# Patient Record
Sex: Male | Born: 2000 | Race: White | Hispanic: No | Marital: Single | Smoking: Never smoker
Health system: Southern US, Community
[De-identification: ages and names within clinical notes are randomized; demographics above are authoritative.]

## PROBLEM LIST (undated history)

## (undated) DIAGNOSIS — J351 Hypertrophy of tonsils: Secondary | ICD-10-CM

## (undated) DIAGNOSIS — F909 Attention-deficit hyperactivity disorder, unspecified type: Secondary | ICD-10-CM

## (undated) HISTORY — PX: OTHER SURGICAL HISTORY: SHX169

## (undated) HISTORY — PX: ADENOIDECTOMY: SUR15

## (undated) HISTORY — DX: Hypertrophy of tonsils: J35.1

---
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
## 2022-08-28 IMAGING — DX DG WRIST COMPLETE 3+V*L*
4 series · 4 of 4 positions shown · non-contrast
Comparison: September 16, 2013.

CLINICAL DATA: Chronic left wrist pain after injury last year.

EXAM:
LEFT WRIST - COMPLETE 3+ VIEW

[wrist pa]
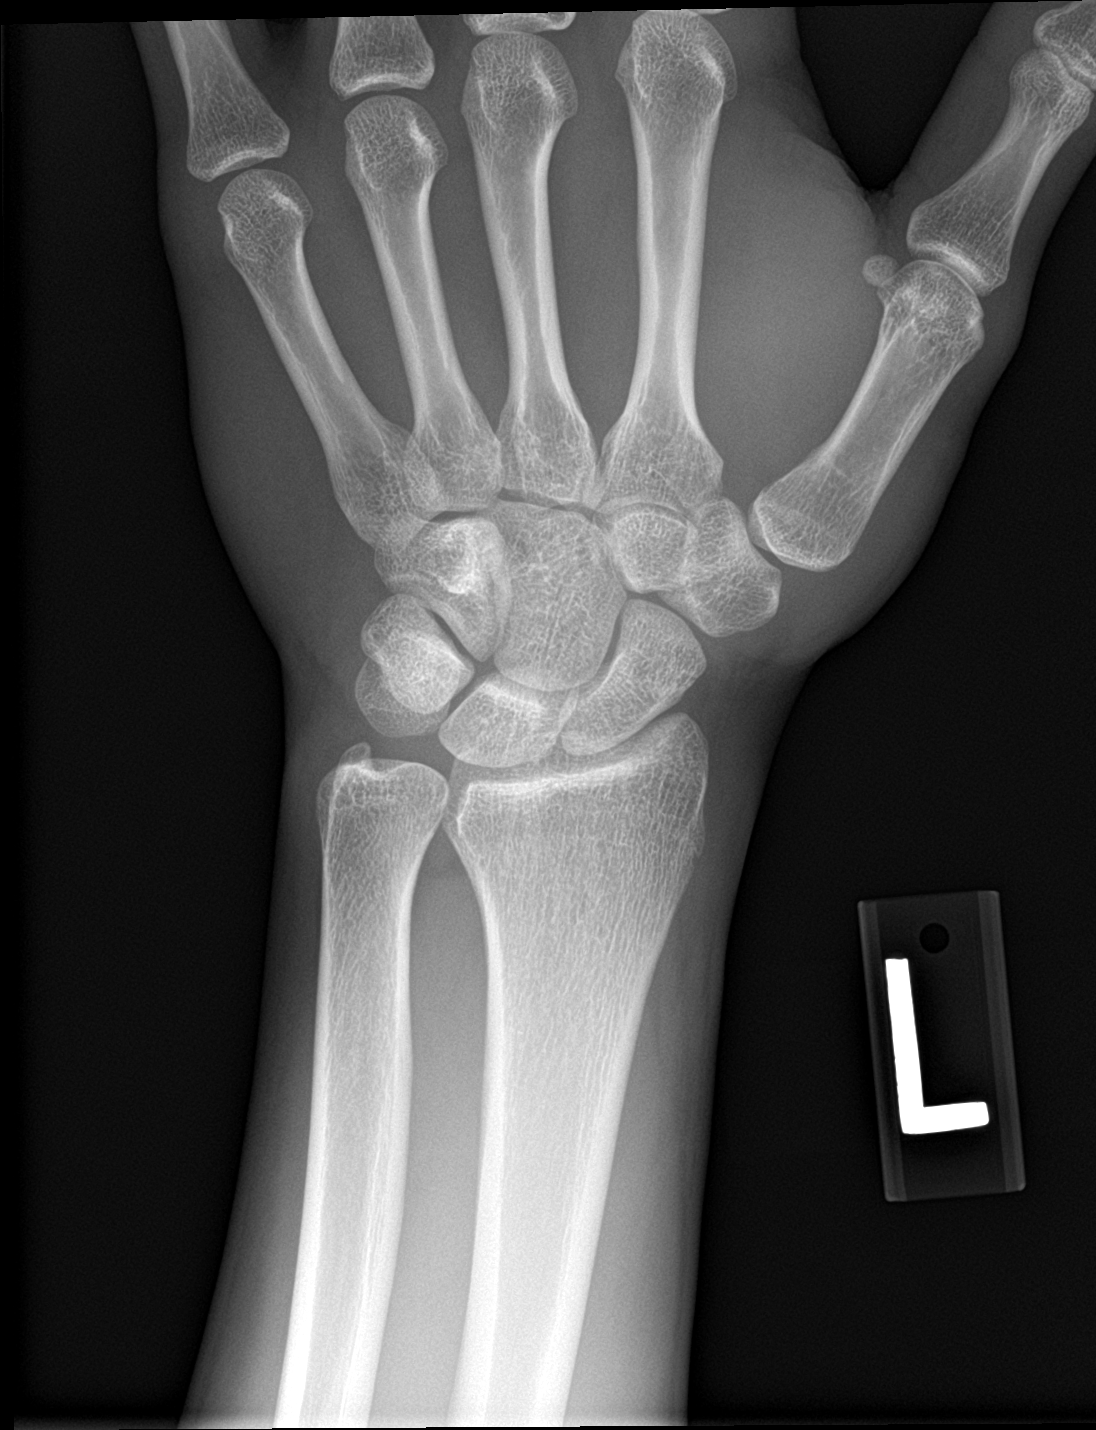

[wrist obl]
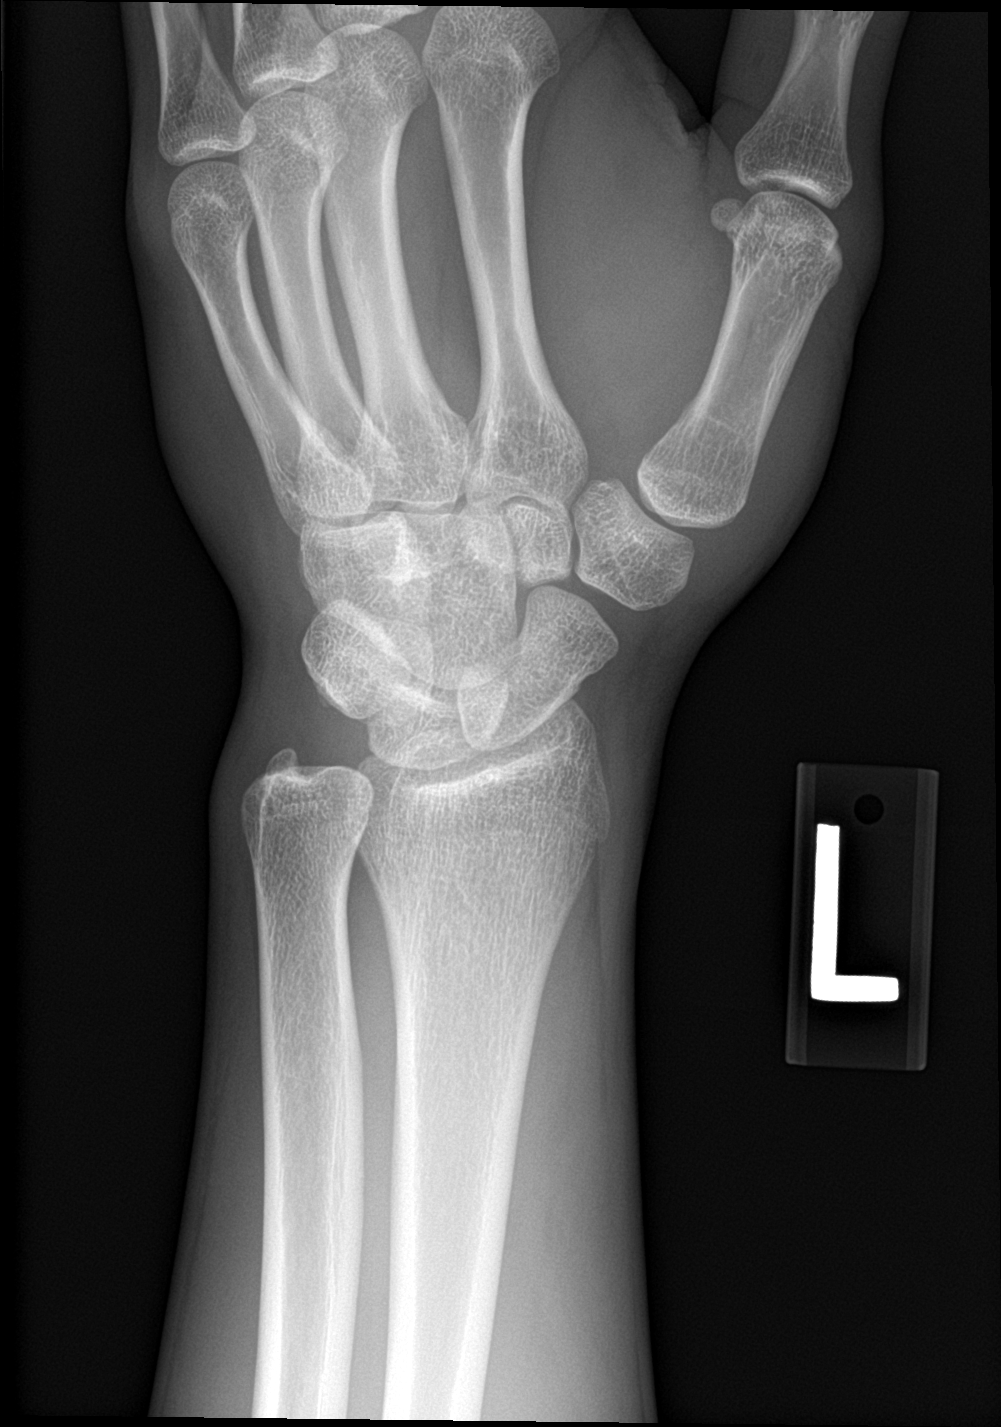

[wrist lat]
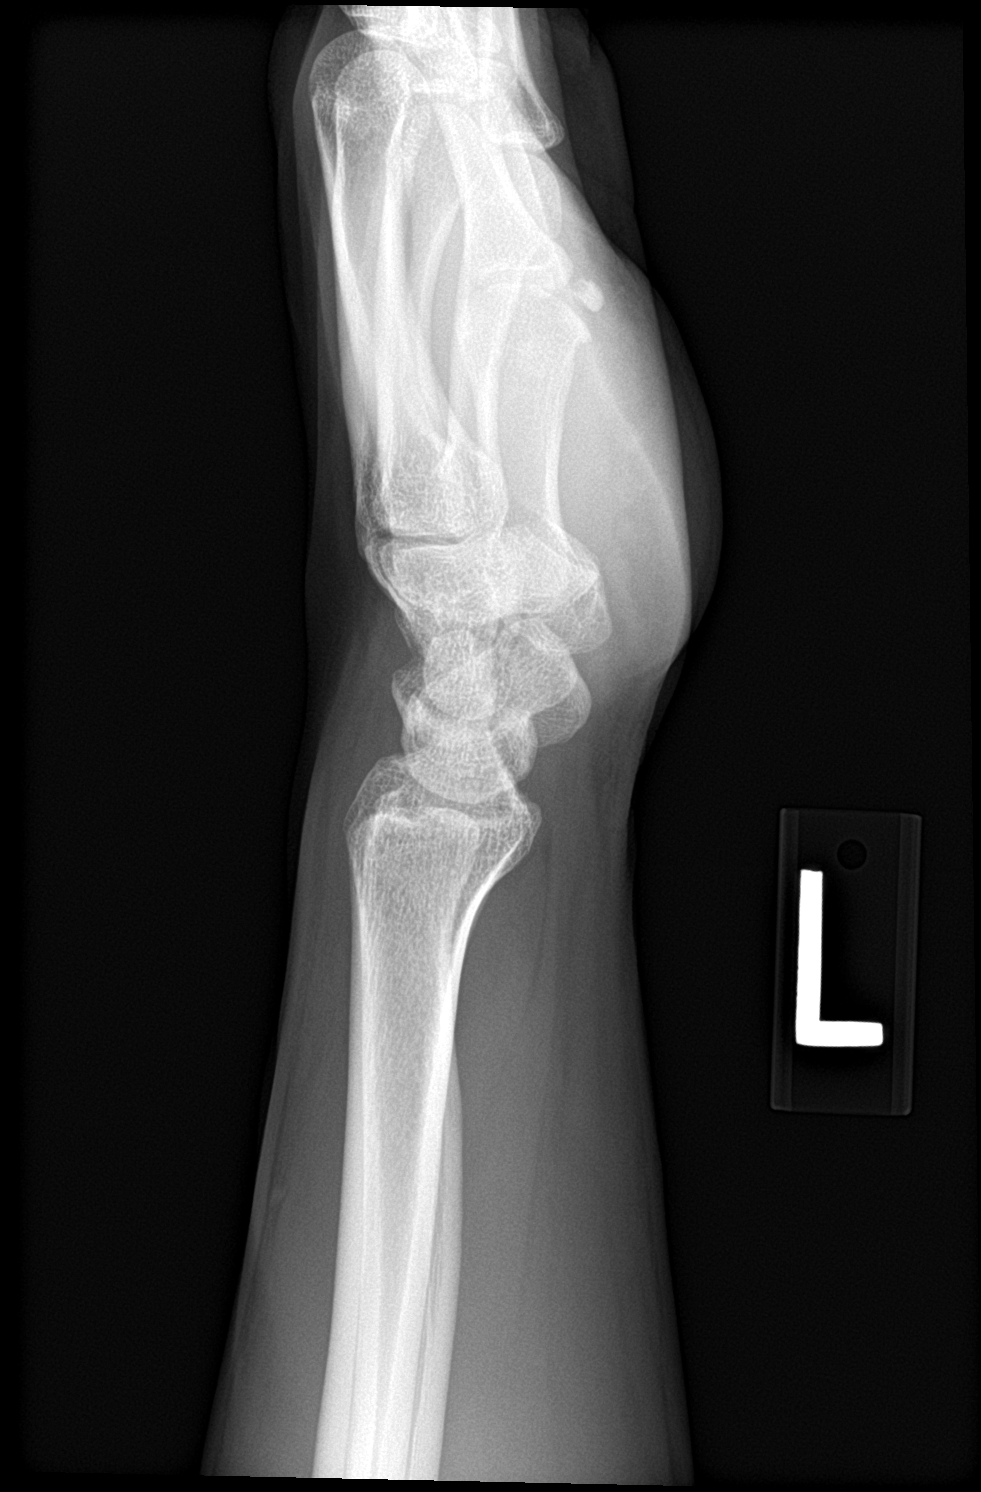

[wrist navicular]
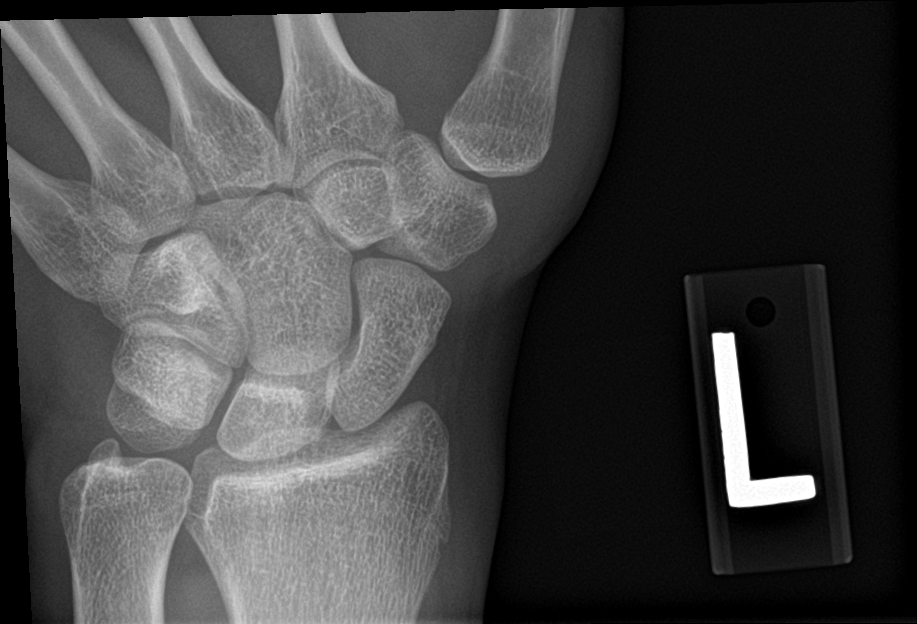

[4 of 4 positions shown; findings below may reference images not displayed]

FINDINGS: There is no evidence of fracture or dislocation. There is no
evidence of arthropathy or other focal bone abnormality. Soft
tissues are unremarkable.
IMPRESSION: Negative.

## 2023-01-09 ENCOUNTER — Ambulatory Visit: Payer: BC Managed Care – PPO | Admitting: Family Medicine

## 2023-04-17 ENCOUNTER — Ambulatory Visit: Payer: 59

## 2023-04-17 ENCOUNTER — Encounter: Payer: Self-pay | Admitting: Sports Medicine

## 2023-04-17 ENCOUNTER — Ambulatory Visit (INDEPENDENT_AMBULATORY_CARE_PROVIDER_SITE_OTHER): Payer: 59 | Admitting: Sports Medicine

## 2023-04-17 VITALS — BP 140/81 | HR 83

## 2023-04-17 DIAGNOSIS — R0689 Other abnormalities of breathing: Secondary | ICD-10-CM | POA: Diagnosis not present

## 2023-04-17 DIAGNOSIS — J111 Influenza due to unidentified influenza virus with other respiratory manifestations: Secondary | ICD-10-CM | POA: Diagnosis not present

## 2023-04-17 LAB — POCT INFLUENZA A/B
Influenza A, POC: NEGATIVE
Influenza B, POC: NEGATIVE

## 2023-04-17 LAB — POC COVID19 BINAXNOW: SARS Coronavirus 2 Ag: NEGATIVE

## 2023-04-17 MED ORDER — ONDANSETRON 8 MG PO TBDP: 8.0000 mg | ORAL_TABLET | Freq: Three times a day (TID) | ORAL | 3 refills | Status: AC | PRN

## 2023-04-17 MED ORDER — AZITHROMYCIN 250 MG PO TABS: ORAL_TABLET | ORAL | 0 refills | Status: DC

## 2023-04-17 MED ORDER — PREDNISONE 50 MG PO TABS: ORAL_TABLET | ORAL | 0 refills | Status: DC

## 2023-04-17 NOTE — Progress Notes (Signed)
    Procedures performed today:    None.  Independent interpretation of notes and tests performed by another provider:   None.  Brief History, Exam, Impression, and Recommendations:    Influenza-like illness Mark Patel is a pleasant previously healthy 23 year old male, he has started welding school. Unfortunately over the past 3 days he has had increasing symptoms of headache, dry cough, fevers. He does get significant nausea and vomiting whenever he tries to keep down fluids. On exam he appears ill, ear canals look normal, he does have significant postnasal drip with erythema. Minimal cervical lymphadenopathy with no areas of tenderness, no frontal or maxillary tenderness. Lungs are clear with the exception of trace coarse sounds right lower lobe. COVID and flu swabs were negative today. Considering symptomatology we will do a course of prednisone , Zofran  for the nausea, azithromycin  that he will hold off on and only take if he still has symptoms in about 5 days. We will hold off on Tamiflu and Paxlovid.    ____________________________________________ Debby PARAS. Curtis, M.D., ABFM., CAQSM., AME. Primary Care and Sports Medicine [facility]  Adjunct Professor of G[facility] Medicine  [employer]  [employer], [employer]

## 2023-04-17 NOTE — Addendum Note (Signed)
 Addended by: Arvel Lather A on: 04/17/2023 01:19 PM   Modules accepted: Orders

## 2023-04-17 NOTE — Assessment & Plan Note (Addendum)
 Mark Patel is a pleasant previously healthy 23 year old male, he has started welding school. Unfortunately over the past 3 days he has had increasing symptoms of headache, dry cough, fevers. He does get significant nausea and vomiting whenever he tries to keep down fluids. On exam he appears ill, ear canals look normal, he does have significant postnasal drip with erythema. Minimal cervical lymphadenopathy with no areas of tenderness, no frontal or maxillary tenderness. Lungs are clear with the exception of trace coarse sounds right lower lobe. COVID and flu swabs were negative today. Considering symptomatology we will do a course of prednisone , Zofran  for the nausea, azithromycin  that he will hold off on and only take if he still has symptoms in about 5 days. We will hold off on Tamiflu and Paxlovid.

## 2023-08-25 ENCOUNTER — Ambulatory Visit (INDEPENDENT_AMBULATORY_CARE_PROVIDER_SITE_OTHER): Admitting: Physician Assistant

## 2023-08-25 ENCOUNTER — Encounter: Payer: Self-pay | Admitting: Physician Assistant

## 2023-08-25 VITALS — BP 121/82 | HR 86 | Temp 98.0°F | Ht 68.0 in | Wt 178.0 lb

## 2023-08-25 DIAGNOSIS — J0140 Acute pansinusitis, unspecified: Secondary | ICD-10-CM

## 2023-08-25 MED ORDER — FLUTICASONE PROPIONATE 50 MCG/ACT NA SUSP: 2.0000 | Freq: Every day | NASAL | 0 refills | Status: AC

## 2023-08-25 MED ORDER — AMOXICILLIN-POT CLAVULANATE 875-125 MG PO TABS: 1.0000 | ORAL_TABLET | Freq: Two times a day (BID) | ORAL | 0 refills | Status: AC

## 2023-08-25 NOTE — Patient Instructions (Signed)
 Augmentin for 10 days and flonase 2 sprays each nostril   Sinus Infection, Adult A sinus infection, also called sinusitis, is inflammation of your sinuses. Sinuses are hollow spaces in the bones around your face. Your sinuses are located: Around your eyes. In the middle of your forehead. Behind your nose. In your cheekbones. Mucus normally drains out of your sinuses. When your nasal tissues become inflamed or swollen, mucus can become trapped or blocked. This allows bacteria, viruses, and fungi to grow, which leads to infection. Most infections of the sinuses are caused by a virus. A sinus infection can develop quickly. It can last for up to 4 weeks (acute) or for more than 12 weeks (chronic). A sinus infection often develops after a cold. What are the causes? This condition is caused by anything that creates swelling in the sinuses or stops mucus from draining. This includes: Allergies. Asthma. Infection from bacteria or viruses. Deformities or blockages in your nose or sinuses. Abnormal growths in the nose (nasal polyps). Pollutants, such as chemicals or irritants in the air. Infection from fungi. This is rare. What increases the risk? You are more likely to develop this condition if you: Have a weak body defense system (immune system). Do a lot of swimming or diving. Overuse nasal sprays. Smoke. What are the signs or symptoms? The main symptoms of this condition are pain and a feeling of pressure around the affected sinuses. Other symptoms include: Stuffy nose or congestion that makes it difficult to breathe through your nose. Thick yellow or greenish drainage from your nose. Tenderness, swelling, and warmth over the affected sinuses. A cough that may get worse at night. Decreased sense of smell and taste. Extra mucus that collects in the throat or the back of the nose (postnasal drip) causing a sore throat or bad breath. Tiredness (fatigue). Fever. How is this diagnosed? This  condition is diagnosed based on: Your symptoms. Your medical history. A physical exam. Tests to find out if your condition is acute or chronic. This may include: Checking your nose for nasal polyps. Viewing your sinuses using a device that has a light (endoscope). Testing for allergies or bacteria. Imaging tests, such as an MRI or CT scan. In rare cases, a bone biopsy may be done to rule out more serious types of fungal sinus disease. How is this treated? Treatment for a sinus infection depends on the cause and whether your condition is chronic or acute. If caused by a virus, your symptoms should go away on their own within 10 days. You may be given medicines to relieve symptoms. They include: Medicines that shrink swollen nasal passages (decongestants). A spray that eases inflammation of the nostrils (topical intranasal corticosteroids). Rinses that help get rid of thick mucus in your nose (nasal saline washes). Medicines that treat allergies (antihistamines). Over-the-counter pain relievers. If caused by bacteria, your health care provider may recommend waiting to see if your symptoms improve. Most bacterial infections will get better without antibiotic medicine. You may be given antibiotics if you have: A severe infection. A weak immune system. If caused by narrow nasal passages or nasal polyps, surgery may be needed. Follow these instructions at home: Medicines Take, use, or apply over-the-counter and prescription medicines only as told by your health care provider. These may include nasal sprays. If you were prescribed an antibiotic medicine, take it as told by your health care provider. Do not stop taking the antibiotic even if you start to feel better. Hydrate and humidify  Drink  enough fluid to keep your urine pale yellow. Staying hydrated will help to thin your mucus. Use a cool mist humidifier to keep the humidity level in your home above 50%. Inhale steam for 10-15 minutes,  3-4 times a day, or as told by your health care provider. You can do this in the bathroom while a hot shower is running. Limit your exposure to cool or dry air. Rest Rest as much as possible. Sleep with your head raised (elevated). Make sure you get enough sleep each night. General instructions  Apply a warm, moist washcloth to your face 3-4 times a day or as told by your health care provider. This will help with discomfort. Use nasal saline washes as often as told by your health care provider. Wash your hands often with soap and water to reduce your exposure to germs. If soap and water are not available, use hand sanitizer. Do not smoke. Avoid being around people who are smoking (secondhand smoke). Keep all follow-up visits. This is important. Contact a health care provider if: You have a fever. Your symptoms get worse. Your symptoms do not improve within 10 days. Get help right away if: You have a severe headache. You have persistent vomiting. You have severe pain or swelling around your face or eyes. You have vision problems. You develop confusion. Your neck is stiff. You have trouble breathing. These symptoms may be an emergency. Get help right away. Call 911. Do not wait to see if the symptoms will go away. Do not drive yourself to the hospital. Summary A sinus infection is soreness and inflammation of your sinuses. Sinuses are hollow spaces in the bones around your face. This condition is caused by nasal tissues that become inflamed or swollen. The swelling traps or blocks the flow of mucus. This allows bacteria, viruses, and fungi to grow, which leads to infection. If you were prescribed an antibiotic medicine, take it as told by your health care provider. Do not stop taking the antibiotic even if you start to feel better. Keep all follow-up visits. This is important. This information is not intended to replace advice given to you by your health care provider. Make sure you  discuss any questions you have with your health care provider. Document Revised: 01/30/2021 Document Reviewed: 01/30/2021 Elsevier Patient Education  2024 [employer].

## 2023-08-25 NOTE — Progress Notes (Unsigned)
   Acute Office Visit  Subjective:     Patient ID: Mark Patel, male    DOB: 09/17/2000, 23 y.o.   MRN: [id number]  No chief complaint on file.   HPI Patient is in today for *** Cough 1 month Zpack prednisone    ROS      Objective:    There were no vitals taken for this visit. {Vitals History (Optional):23777}  Physical Exam  No results found for any visits on 08/25/23.      Assessment & Plan:   Problem List Items Addressed This Visit   None   No orders of the defined types were placed in this encounter.   No follow-ups on file.  Mckinlee Dunk, PA-C

## 2023-08-26 ENCOUNTER — Encounter: Payer: Self-pay | Admitting: Physician Assistant

## 2023-11-11 ENCOUNTER — Encounter: Payer: Self-pay | Admitting: Sports Medicine
